# Patient Record
Sex: Male | Born: 1960 | Race: Black or African American | Hispanic: No | Marital: Single | Smoking: Former smoker
Health system: Southern US, Community
[De-identification: ages and names within clinical notes are randomized; demographics above are authoritative.]

## PROBLEM LIST (undated history)

## (undated) DIAGNOSIS — E119 Type 2 diabetes mellitus without complications: Secondary | ICD-10-CM

## (undated) DIAGNOSIS — I00 Rheumatic fever without heart involvement: Secondary | ICD-10-CM

## (undated) HISTORY — DX: Type 2 diabetes mellitus without complications: E11.9

---

## 1998-12-20 ENCOUNTER — Encounter: Admission: RE | Admit: 1998-12-20 | Discharge: 1999-03-20 | Payer: Self-pay | Admitting: *Deleted

## 1999-04-11 ENCOUNTER — Encounter: Admission: RE | Admit: 1999-04-11 | Discharge: 1999-07-10 | Payer: Self-pay | Admitting: *Deleted

## 2002-04-15 ENCOUNTER — Emergency Department (HOSPITAL_COMMUNITY): Admission: EM | Admit: 2002-04-15 | Discharge: 2002-04-15 | Payer: Self-pay | Admitting: Emergency Medicine

## 2015-11-18 ENCOUNTER — Emergency Department (HOSPITAL_COMMUNITY)
Admission: EM | Admit: 2015-11-18 | Discharge: 2015-11-18 | Disposition: A | Discharge status: Home / Self Care | Payer: 59 | Attending: Emergency Medicine | Admitting: Emergency Medicine

## 2015-11-18 ENCOUNTER — Emergency Department (HOSPITAL_COMMUNITY): Payer: 59

## 2015-11-18 ENCOUNTER — Encounter (HOSPITAL_COMMUNITY): Payer: Self-pay

## 2015-11-18 DIAGNOSIS — X58XXXA Exposure to other specified factors, initial encounter: Secondary | ICD-10-CM | POA: Insufficient documentation

## 2015-11-18 DIAGNOSIS — S76912A Strain of unspecified muscles, fascia and tendons at thigh level, left thigh, initial encounter: Secondary | ICD-10-CM | POA: Diagnosis not present

## 2015-11-18 DIAGNOSIS — S79922A Unspecified injury of left thigh, initial encounter: Secondary | ICD-10-CM | POA: Diagnosis present

## 2015-11-18 DIAGNOSIS — Y9289 Other specified places as the place of occurrence of the external cause: Secondary | ICD-10-CM | POA: Diagnosis not present

## 2015-11-18 DIAGNOSIS — Y9389 Activity, other specified: Secondary | ICD-10-CM | POA: Insufficient documentation

## 2015-11-18 DIAGNOSIS — Y998 Other external cause status: Secondary | ICD-10-CM | POA: Diagnosis not present

## 2015-11-18 DIAGNOSIS — Z87891 Personal history of nicotine dependence: Secondary | ICD-10-CM | POA: Insufficient documentation

## 2015-11-18 DIAGNOSIS — Z8679 Personal history of other diseases of the circulatory system: Secondary | ICD-10-CM | POA: Diagnosis not present

## 2015-11-18 HISTORY — DX: Rheumatic fever without heart involvement: I00

## 2015-11-18 MED ORDER — METHOCARBAMOL 500 MG PO TABS: 500.0000 mg | ORAL_TABLET | Freq: Two times a day (BID) | ORAL | Status: DC

## 2015-11-18 MED ORDER — KETOROLAC TROMETHAMINE 30 MG/ML IJ SOLN
30.0000 mg | Freq: Once | INTRAMUSCULAR | Status: AC
  Administered 2015-11-18: 30 mg via INTRAMUSCULAR
  Filled 2015-11-18: qty 1

## 2015-11-18 MED ORDER — METHOCARBAMOL 500 MG PO TABS
1000.0000 mg | ORAL_TABLET | Freq: Once | ORAL | Status: AC
  Administered 2015-11-18: 1000 mg via ORAL
  Filled 2015-11-18: qty 2

## 2015-11-18 MED ORDER — IBUPROFEN 800 MG PO TABS: 800.0000 mg | ORAL_TABLET | Freq: Three times a day (TID) | ORAL | Status: AC

## 2015-11-18 NOTE — ED Provider Notes (Signed)
CSN: [id number]     Arrival date & time 11/18/15  1341 History  By signing my name below, I, Emmanuella Mensah, attest that this documentation has been prepared under the direction and in the presence of [facility], PA-C. Electronically Signed: Angelene Giovanni, ED Scribe. 11/18/2015. 2:21 PM.    Chief Complaint  Patient presents with  . Leg Pain    lt thigh/buttocks area    The history is provided by the patient. No language interpreter was used.   HPI Comments: Adam Pearson is a 55 y.o. male who presents to the Emergency Department complaining of gradually worsening intermittent moderate sharp pain to the left lateral side of his left upper leg onset 2 weeks ago. He reports associated difficulty ambulating secondary to pain. He explains that the pain usually resolves when he gets in the car to go to work in the am. He denies any recent falls, injuries, or trauma. He states that he took 4 tablets of Advil today PTA with no relief. He denies any back pain, abdominal pain, groin pain, testicular pain, color change, rash, or lacerations.    Past Medical History  Diagnosis Date  . Rheumatic fever    History reviewed. No pertinent past surgical history. History reviewed. No pertinent family history. Social History  Substance Use Topics  . Smoking status: Former Games developer  . Smokeless tobacco: None  . Alcohol Use: Yes    Review of Systems  Gastrointestinal: Negative for abdominal pain.  Genitourinary: Negative for penile pain and testicular pain.  Musculoskeletal: Positive for arthralgias (left lateral side of left upper leg) and gait problem (secondary to pain). Negative for back pain.  Skin: Negative for color change, rash and wound.      Allergies  Review of patient's allergies indicates no known allergies.  Home Medications   Prior to Admission medications   Medication Sig Start Date End Date Taking? Authorizing Provider  ibuprofen (ADVIL,MOTRIN) 800 MG tablet Take 1 tablet  (800 mg total) by mouth 3 (three) times daily. 11/18/15   Chase Picket Karin Pinedo, PA-C  methocarbamol (ROBAXIN) 500 MG tablet Take 1 tablet (500 mg total) by mouth 2 (two) times daily. 11/18/15   Riki Gehring Pilcher Dauna Ziska, PA-C   BP 133/78 mmHg  Pulse 70  Temp(Src) 99 F (37.2 C) (Oral)  Resp 16  SpO2 100% Physical Exam  Constitutional: He is oriented to person, place, and time. He appears well-developed and well-nourished.  HENT:  Head: Normocephalic and atraumatic.  Neck: Normal range of motion. Neck supple.  Cardiovascular: Normal rate, regular rhythm and normal heart sounds.   Pulmonary/Chest: Effort normal and breath sounds normal. No respiratory distress.  Abdominal: Soft. Bowel sounds are normal. He exhibits no distension. There is no tenderness.  Musculoskeletal:  TTP of left lateral thigh. Full ROM. Pain with Ober's test. No deformities, erythema, ecchymosis, or swelling appreciated. Good cap refill in LLE. 2+ distal pulse.   Neurological: He is alert and oriented to person, place, and time.  Skin: Skin is warm and dry.  Psychiatric: He has a normal mood and affect.  Nursing note and vitals reviewed.   ED Course  Procedures (including critical care time) DIAGNOSTIC STUDIES: Oxygen Saturation is 100% on RA, normal by my interpretation.    COORDINATION OF CARE: 2:19 PM- Pt advised of plan for treatment and pt agrees. Pt will receive x-ray for further evaluation. He will receive muscle relaxants.    Labs Review Labs Reviewed - No data to display  Imaging Review Dg Hip  Unilat With Pelvis 2-3 Views Left  11/18/2015  CLINICAL DATA:  Left lateral hip pain upon wakening today. EXAM: DG HIP (WITH OR WITHOUT PELVIS) 2-3V LEFT COMPARISON:  None. FINDINGS: There is no evidence of hip fracture or dislocation. There is no evidence of arthropathy or other focal bone abnormality. IMPRESSION: Normal radiographs Electronically Signed   By: Paulina Fusi M.D.   On: 11/18/2015 14:45   Dg Femur 1v  Left  11/18/2015  CLINICAL DATA:  Lateral left hip and femur pain. EXAM: Left femur, one-view) COMPARISON:  None. FINDINGS: Study reviewed along with the left hip exam performed at same time. A single frontal view of the left femur shows no evidence for an acute fracture. No worrisome lytic or sclerotic osseous abnormality. IMPRESSION: No evidence for femur fracture. Electronically Signed   By: Kennith Center M.D.   On: 11/18/2015 14:48     Marijean Niemann Morris Markham, PA-C has personally reviewed and evaluated these images and lab results as part of her medical decision-making.   MDM   Final diagnoses:  Muscle strain of thigh, left, initial encounter   Adam Pearson presents with left lateral leg pain which appears musculoskeletal in nature. X-rays unremarkable. Patient given Toradol IM and robaxin in ED. Re-evaluated and feels improved. Will dc to home with RICE, symptomatic care, Robaxin and ibuprofen. Return precautions and follow up instructions given. All questions answered.   I personally performed the services described in this documentation, which was scribed in my presence. The recorded information has been reviewed and is accurate.  [facility] Mayerly Kaman, PA-C 11/18/15 1512  Arby Barrette, MD 11/24/15 2155

## 2015-11-18 NOTE — ED Notes (Signed)
Patient transported to X-ray 

## 2015-11-18 NOTE — ED Notes (Signed)
Pt c/o intermittent L groin pain x 2 weeks which became constant yesterday.  Pain score 10/10.  Pt reports taking ibuprofen w/o relief.  Denies injury.  Denies GU complaints.  Denies discoloration, warmth, and swelling.

## 2015-11-18 NOTE — Discharge Instructions (Signed)
Be sure to read and understand instructions below prior to leaving the hospital. Take ibuprofen three times daily for the next three days, after that, take as needed for pain. Take robaxin (muscle relaxer) as needed for pain - This can make you very drowsy - please do not drink or drive on this medication. Return to ER for any new or worsening symptoms, any additional concerns.   TREATMENT  Rest, ice, elevation, and compression are the basic modes of treatment.    HOME CARE INSTRUCTIONS  Apply ice to the sore area for 15 to 20 minutes, 3 to 4 times per day. Do this while you are awake for the first 2 days. This can be stopped when the swelling goes away. Put the ice in a plastic bag and place a towel between the bag of ice and your skin.  Keep your leg elevated when possible to lessen swelling.  ACTIVITY:            - Weight bearing as tolerated            - Exercises should be limited to pain free range of motion       SEEK MEDICAL CARE IF:  You have an increase in bruising, swelling, or pain.  Your toes feel cold.   Pain relief is not achieved with medications.   MAKE SURE YOU:  Understand these instructions.  Will watch your condition.  Will get help right away if you are not doing well or get worse   COLD THERAPY DIRECTIONS:  Ice or gel packs can be used to reduce both pain and swelling. Ice is the most helpful within the first 24 to 48 hours after an injury or flareup from overusing a muscle or joint.  Ice is effective, has very few side effects, and is safe for most people to use.   If you expose your skin to cold temperatures for too long or without the proper protection, you can damage your skin or nerves. Watch for signs of skin damage due to cold.   HOME CARE INSTRUCTIONS  Follow these tips to use ice and cold packs safely.  Place a dry or damp towel between the ice and skin. A damp towel will cool the skin more quickly, so you may need to shorten the time that the ice is  used.  For a more rapid response, add gentle compression to the ice.  Ice for no more than 10 to 20 minutes at a time. The bonier the area you are icing, the less time it will take to get the benefits of ice.  Check your skin after 5 minutes to make sure there are no signs of a poor response to cold or skin damage.  Rest 20 minutes or more in between uses.  Once your skin is numb, you can end your treatment. You can test numbness by very lightly touching your skin. The touch should be so light that you do not see the skin dimple from the pressure of your fingertip. When using ice, most people will feel these normal sensations in this order: cold, burning, aching, and numbness.

## 2016-12-18 DIAGNOSIS — E7800 Pure hypercholesterolemia, unspecified: Secondary | ICD-10-CM | POA: Diagnosis not present

## 2016-12-18 DIAGNOSIS — E1165 Type 2 diabetes mellitus with hyperglycemia: Secondary | ICD-10-CM | POA: Diagnosis not present

## 2016-12-18 DIAGNOSIS — Z0000 Encounter for general adult medical examination without abnormal findings: Secondary | ICD-10-CM | POA: Diagnosis not present

## 2017-02-23 DIAGNOSIS — E119 Type 2 diabetes mellitus without complications: Secondary | ICD-10-CM | POA: Diagnosis not present

## 2017-02-23 DIAGNOSIS — H40013 Open angle with borderline findings, low risk, bilateral: Secondary | ICD-10-CM | POA: Diagnosis not present

## 2017-02-23 DIAGNOSIS — H35363 Drusen (degenerative) of macula, bilateral: Secondary | ICD-10-CM | POA: Diagnosis not present

## 2017-03-17 IMAGING — CR DG FEMUR 1V*L*
2 series · 2 of 2 positions shown · non-contrast
Comparison: None.

CLINICAL DATA: Lateral left hip and femur pain.

EXAM:
Left femur, one-view)

[t femur proximal ap left]
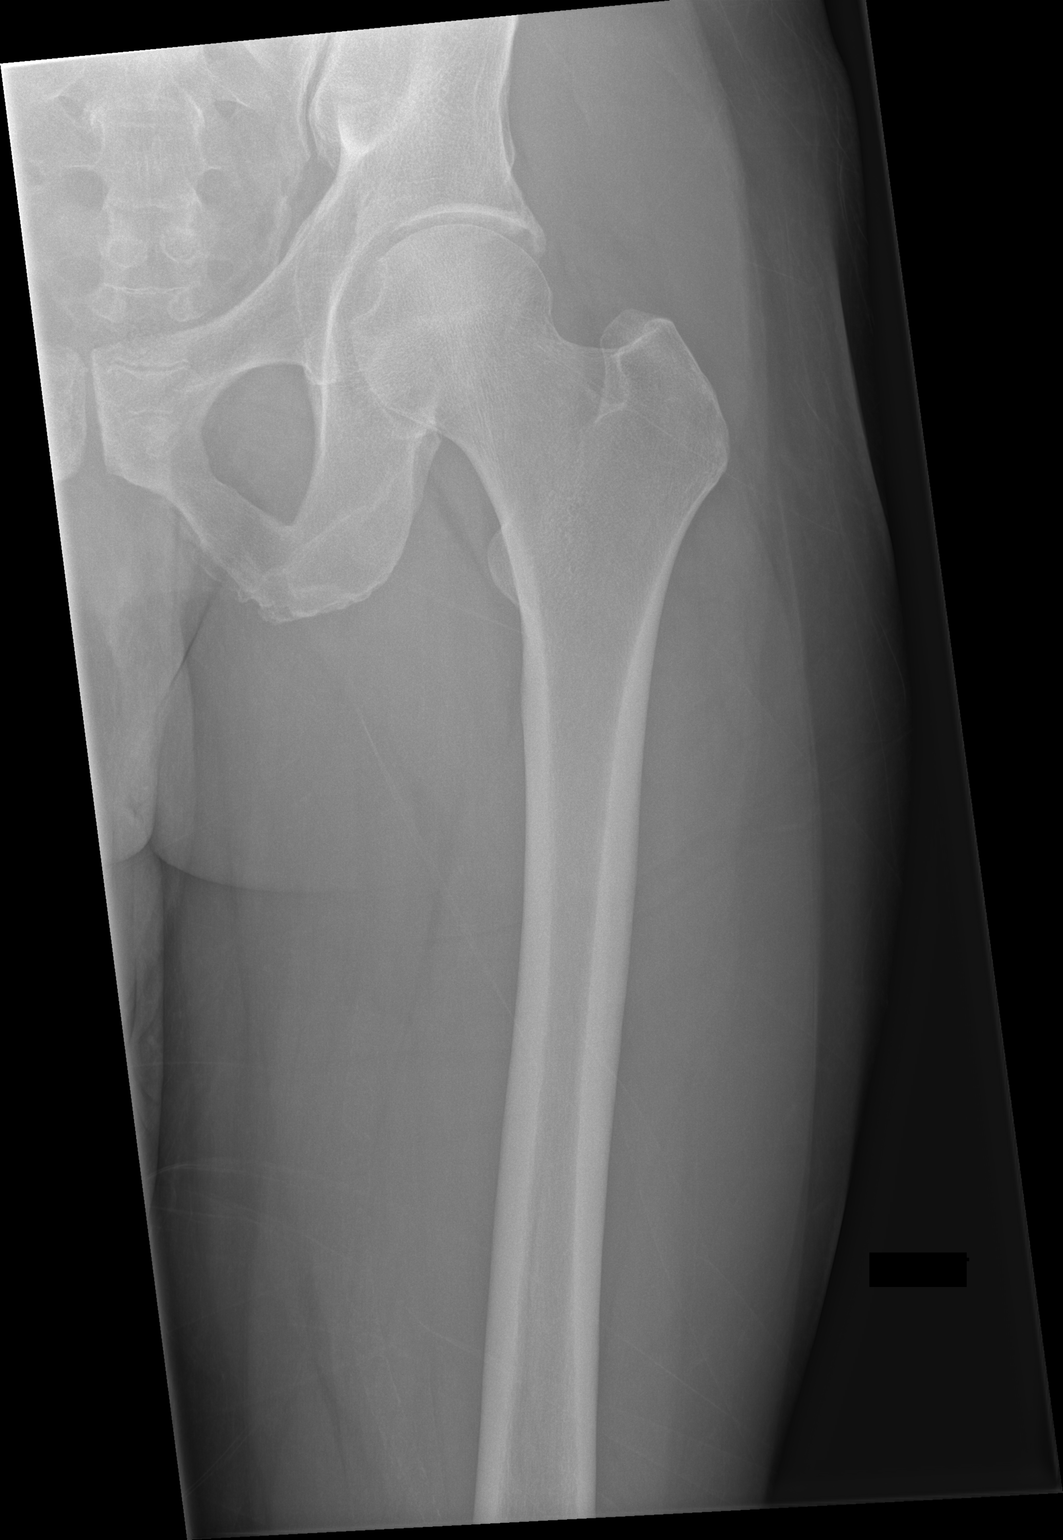

[t femur distal ap left]
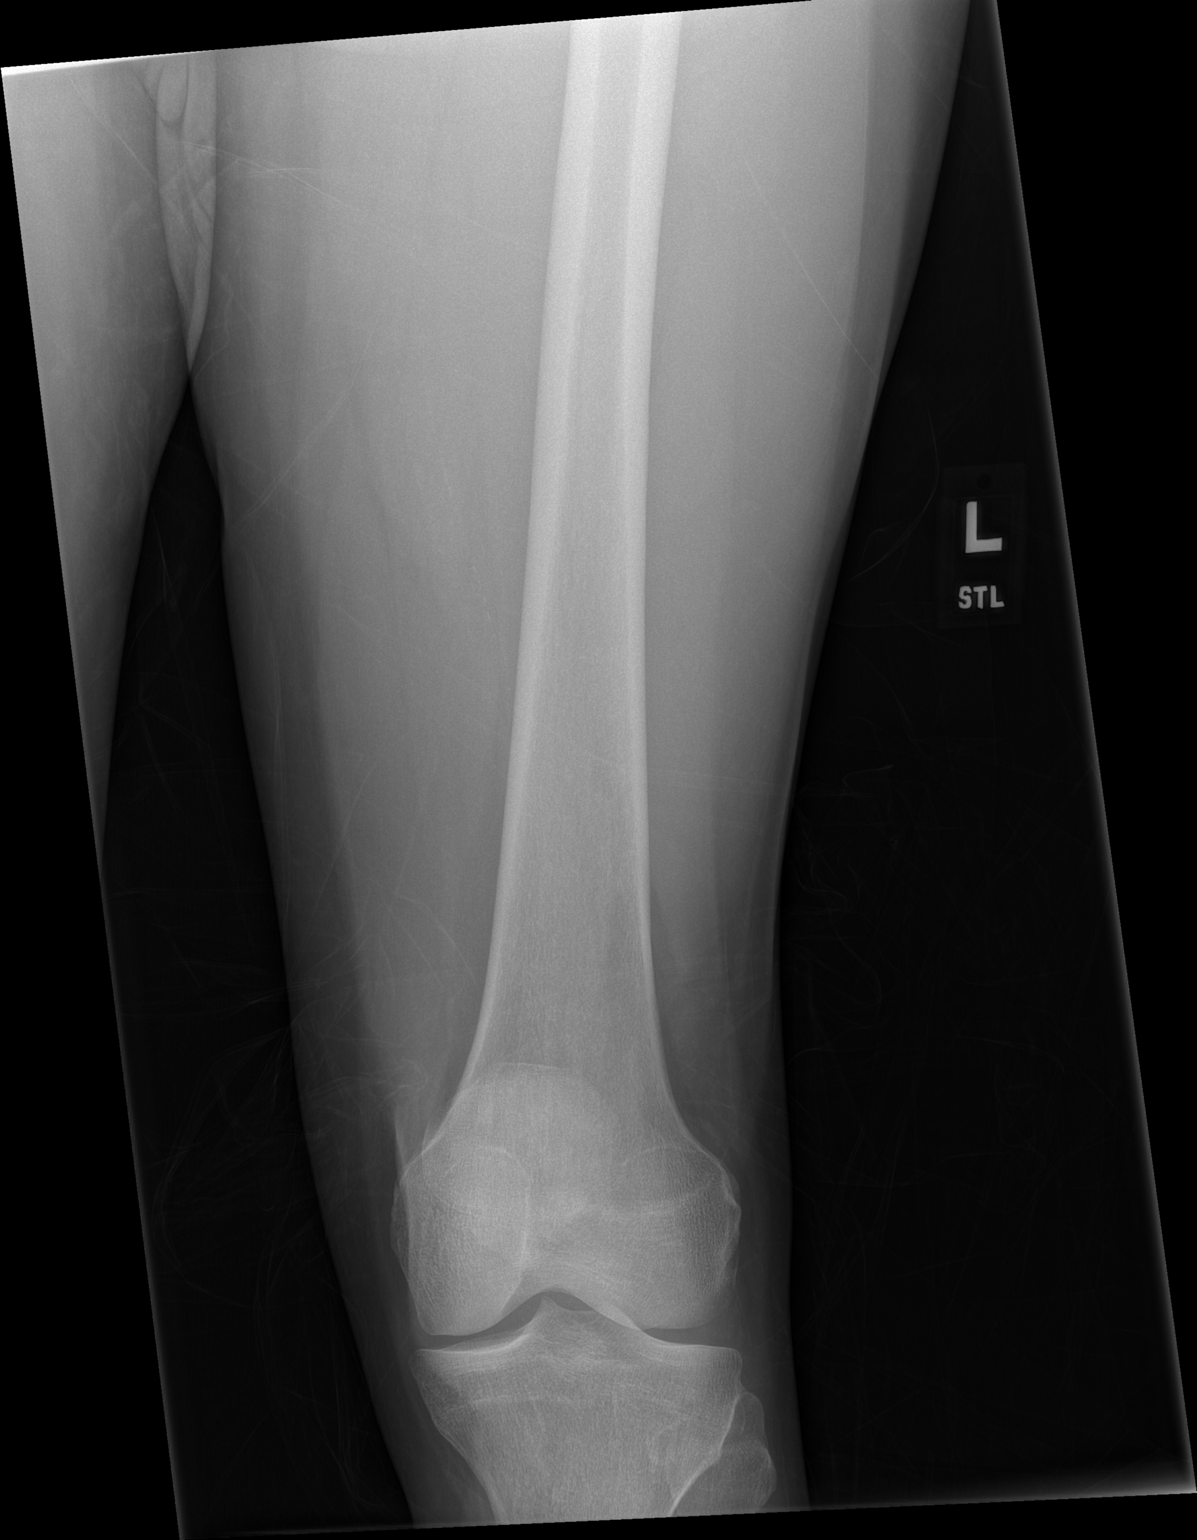

[2 of 2 positions shown; findings below may reference images not displayed]

FINDINGS: Study reviewed along with the left hip exam performed at same time.
A single frontal view of the left femur shows no evidence for an
acute fracture. No worrisome lytic or sclerotic osseous abnormality.
IMPRESSION: No evidence for femur fracture.

## 2017-03-19 DIAGNOSIS — E119 Type 2 diabetes mellitus without complications: Secondary | ICD-10-CM | POA: Diagnosis not present

## 2017-03-19 DIAGNOSIS — R030 Elevated blood-pressure reading, without diagnosis of hypertension: Secondary | ICD-10-CM | POA: Diagnosis not present

## 2017-03-19 DIAGNOSIS — E7800 Pure hypercholesterolemia, unspecified: Secondary | ICD-10-CM | POA: Diagnosis not present

## 2017-08-14 DIAGNOSIS — B351 Tinea unguium: Secondary | ICD-10-CM | POA: Diagnosis not present

## 2017-08-14 DIAGNOSIS — B353 Tinea pedis: Secondary | ICD-10-CM | POA: Diagnosis not present

## 2017-08-25 DIAGNOSIS — H40013 Open angle with borderline findings, low risk, bilateral: Secondary | ICD-10-CM | POA: Diagnosis not present

## 2017-09-21 DIAGNOSIS — E7800 Pure hypercholesterolemia, unspecified: Secondary | ICD-10-CM | POA: Diagnosis not present

## 2017-09-21 DIAGNOSIS — R030 Elevated blood-pressure reading, without diagnosis of hypertension: Secondary | ICD-10-CM | POA: Diagnosis not present

## 2017-09-21 DIAGNOSIS — E119 Type 2 diabetes mellitus without complications: Secondary | ICD-10-CM | POA: Diagnosis not present

## 2018-03-01 DIAGNOSIS — E119 Type 2 diabetes mellitus without complications: Secondary | ICD-10-CM | POA: Diagnosis not present

## 2018-03-01 DIAGNOSIS — H2513 Age-related nuclear cataract, bilateral: Secondary | ICD-10-CM | POA: Diagnosis not present

## 2018-03-01 DIAGNOSIS — H40013 Open angle with borderline findings, low risk, bilateral: Secondary | ICD-10-CM | POA: Diagnosis not present

## 2018-04-27 DIAGNOSIS — E7800 Pure hypercholesterolemia, unspecified: Secondary | ICD-10-CM | POA: Diagnosis not present

## 2018-04-27 DIAGNOSIS — Z0000 Encounter for general adult medical examination without abnormal findings: Secondary | ICD-10-CM | POA: Diagnosis not present

## 2018-04-27 DIAGNOSIS — E1169 Type 2 diabetes mellitus with other specified complication: Secondary | ICD-10-CM | POA: Diagnosis not present

## 2018-04-27 DIAGNOSIS — Z1159 Encounter for screening for other viral diseases: Secondary | ICD-10-CM | POA: Diagnosis not present

## 2018-04-27 DIAGNOSIS — Z1211 Encounter for screening for malignant neoplasm of colon: Secondary | ICD-10-CM | POA: Diagnosis not present

## 2018-04-27 DIAGNOSIS — Z125 Encounter for screening for malignant neoplasm of prostate: Secondary | ICD-10-CM | POA: Diagnosis not present

## 2021-11-27 ENCOUNTER — Encounter: Payer: Self-pay | Admitting: Neurology

## 2022-01-01 ENCOUNTER — Ambulatory Visit: Payer: 59 | Admitting: Neurology

## 2022-01-01 ENCOUNTER — Encounter: Payer: Self-pay | Admitting: Neurology

## 2022-01-01 ENCOUNTER — Other Ambulatory Visit: Payer: Self-pay

## 2022-01-01 VITALS — BP 147/84 | HR 75 | Resp 18 | Ht 71.0 in | Wt 185.0 lb

## 2022-01-01 DIAGNOSIS — G43009 Migraine without aura, not intractable, without status migrainosus: Secondary | ICD-10-CM

## 2022-01-01 DIAGNOSIS — H9313 Tinnitus, bilateral: Secondary | ICD-10-CM

## 2022-01-01 DIAGNOSIS — R42 Dizziness and giddiness: Secondary | ICD-10-CM | POA: Diagnosis not present

## 2022-01-01 NOTE — Progress Notes (Signed)
? ?NEUROLOGY CONSULTATION NOTE ? ?Hanley Seamen ?MRN: [id number] ?DOB: 05-13-1961 ? ?Referring provider: Dr. Tally Joe ?Primary care provider: Dr. Tally Joe ? ?Reason for consult:  vertigo ? ?Dear Dr Azucena Cecil: ? ?Thank you for your kind referral of Lekendrick Alpern for consultation of the above symptoms. Although his history is well known to you, please allow me to reiterate it for the purpose of our medical record. He is alone in the office today. Records and images were personally reviewed where available. ? ? ?HISTORY OF PRESENT ILLNESS: ?This is a 61 year old right-handed man with a history of diet-controlled diabetes, herniated disc, migraines, presenting for evaluation of vertigo. He reports that after he was treated for a herniated disc 6 years ago, he started having vertigo and slight migraines. He recalls with the first episode he fell forward and passed out for a couple of minutes. He would have it when bending down with the weed eater, he would bend over, stand then fall over. They were initially occurring every couple of months, but recently have increased to 3-4 times a month. They usually last 15-25 seconds. He is usually aware of his surroundings, no confusion. He has to stop talking because he is scared, but he can talk/comprehend. He has had it while sitting on the toilet, or while standing and talking, the room would start spinning and he staggers, feeling like he will fall down and have to catch himself. If he was sitting on the toilet, he may fall to the right or forward, sometimes he staggers to the left side. He denies any vertigo when supine. There is no nausea/vomiting. He reports pulsatile tinnitus, he can hear his heart beating with swooshing and popping in both ears, worse on the left. No vision changes. Sometimes his right hand and feet would tingle when supine. He has a charley horse in his left leg, right hand, and both feet. He also reports migraines that occur separately, also  happening 3-4 times a month depending on his activities, worse when hot. He describes pulsating/throbbing pain on the frontal region radiating to the left lasting 30 minutes to 3 hours. If the pain is moderate, he does not take medication. For severe pain he takes Excedrin migraine but it does not help. He takes it around once a month. He is sensitive to lights, no nausea/vomiting. His older sister has migraines. He lives alone. He states the vertigo does not occur when driving. He denies any olfactory/gustatory hallucinations, myoclonic jerks, staring/unresponsive episodes. He denies any gaps in time but has a short attention span, people have told him they have to repeat themselves, he says he cannot hear them. He reports hearing loss. He has been working as a Designer, industrial/product for 19 years. He usually gets 6-7 hours of sleep. He drinks around 2 beers every 2 weeks.  ? ? ?PAST MEDICAL HISTORY: ?Past Medical History:  ?Diagnosis Date  ? Diabetes mellitus without complication (HCC)   ? Rheumatic fever   ? ? ?PAST SURGICAL HISTORY: ?History reviewed. No pertinent surgical history. ? ?MEDICATIONS: ?Current Outpatient Medications on File Prior to Visit  ?Medication Sig Dispense Refill  ? ibuprofen (ADVIL,MOTRIN) 800 MG tablet Take 1 tablet (800 mg total) by mouth 3 (three) times daily. (Patient not taking: Reported on 01/01/2022) 21 tablet 0  ? methocarbamol (ROBAXIN) 500 MG tablet Take 1 tablet (500 mg total) by mouth 2 (two) times daily. (Patient not taking: Reported on 01/01/2022) 20 tablet 0  ? ?No current facility-administered medications on  file prior to visit.  ? ? ?ALLERGIES: ?No Known Allergies ? ?FAMILY HISTORY: ?History reviewed. No pertinent family history. ? ?SOCIAL HISTORY: ?Social History  ? ?Socioeconomic History  ? Marital status: Single  ?  Spouse name: Not on file  ? Number of children: 2  ? Years of education: Not on file  ? Highest education level: Not on file  ?Occupational History  ? Not on file   ?Tobacco Use  ? Smoking status: Former  ? Smokeless tobacco: Not on file  ?Substance and Sexual Activity  ? Alcohol use: Yes  ? Drug use: No  ? Sexual activity: Not on file  ?Other Topics Concern  ? Not on file  ?Social History Narrative  ? Right handed  ? No caffeine  ? One story home  ? ?Social Determinants of Health  ? ?Financial Resource Strain: Not on file  ?Food Insecurity: Not on file  ?Transportation Needs: Not on file  ?Physical Activity: Not on file  ?Stress: Not on file  ?Social Connections: Not on file  ?Intimate Partner Violence: Not on file  ? ? ? ?PHYSICAL EXAM: ?Vitals:  ? 01/01/22 0853  ?BP: (!) 147/84  ?Pulse: 75  ?Resp: 18  ?SpO2: 98%  ? ?General: No acute distress ?Head:  Normocephalic/atraumatic ?Skin/Extremities: No rash, no edema ?Neurological Exam: ?Mental status: alert and awake, no dysarthria or aphasia, Fund of knowledge is appropriate.  Attention and concentration are normal.    ?Cranial nerves: ?CN I: not tested ?CN II: pupils equal, round and reactive to light, visual fields intact ?CN III, IV, VI:  full range of motion, no nystagmus, no ptosis ?CN V: facial sensation intact ?CN VII: upper and lower face symmetric ?CN VIII: hearing intact to conversation ?CN IX, X: gag intact, uvula midline ?CN XI: sternocleidomastoid and trapezius muscles intact ?CN XII: tongue midline ?Bulk & Tone: normal, no fasciculations. ?Motor: 5/5 throughout with no pronator drift. ?Sensation: intact to light touch, cold, pin, vibration sense.  No extinction to double simultaneous stimulation.  Romberg test negative ?Deep Tendon Reflexes: +2 throughout ?Cerebellar: no incoordination on finger to nose testing ?Gait: narrow-based and steady, able to tandem walk adequately. ?Tremor: none ? ? ?IMPRESSION: ?This is a 61 year old right-handed man with a history of diet-controlled diabetes, herniated disc, migraines, presenting for evaluation of vertigo. His neurological exam is normal. He reports episodes of vertigo,  as well as migraines, occurring independently of each other 3-4 times a month. His neurological exam is normal. MRI brain with and without contrast will be ordered to assess for underlying structural abnormality. He reports pulsatile tinnitus, MRA head without contrast will be ordered to assess for vascular abnormality. We will do an EEG for completion. We discussed doing vestibular therapy for the vertigo and starting migraine prophylactic medication. He is agreeable to starting Topiramate 25mg  qhs x 1 week, then increase to 50mg  qhs, side effects discussed. He will be referred to ENT for evaluation of tinnitus, hearing loss, and vertigo. Follow-up in 3 months, call for any changes.  ? ? ?Thank you for allowing me to participate in the care of this patient. Please do not hesitate to call for any questions or concerns. ? ? ? , M.D. ? ?CC: Dr. ? ? ?

## 2022-01-01 NOTE — Patient Instructions (Addendum)
Good to meet you. ? ?Schedule MRI brain with and without contrast and MRA head without contrast ? ?2. Schedule routine EEG ? ?3. Start Topiramate 25mg : take 1 tablet every night for 1 week, then increase to 2 tablets every night. ? ?4. Refer to Vestibular therapy ? ?5. Refer to ENT for tinnitus, hearing loss, vertigo ? ?6. Follow-up in 3 months, call for any changes ? ?We have sent a referral to [facility] for your MRI and MRA  they will call you directly to schedule your appointment. They are located at [address] [facility]. If you need to contact them directly please call [phone].  ? ? ?

## 2022-01-18 ENCOUNTER — Other Ambulatory Visit: Payer: 59

## 2022-01-20 ENCOUNTER — Ambulatory Visit: Payer: 59 | Admitting: Neurology

## 2022-01-20 DIAGNOSIS — R42 Dizziness and giddiness: Secondary | ICD-10-CM

## 2022-01-20 NOTE — Procedures (Signed)
ELECTROENCEPHALOGRAM REPORT ? ?Date of Study: 01/20/2022 ? ?Patient's Name: Adam Pearson ?MRN: [id number] ?Date of Birth: 06-Mar-1961 ? ?Referring Provider: Dr. Patrcia Dolly ? ?Clinical History: This is a 61 year old man with recurrent vertigo, initially with loss of consciousness. EEG for classification. ? ?Medications: ?none ? ?Technical Summary: ?A multichannel digital EEG recording measured by the international 10-20 system with electrodes applied with paste and impedances below 5000 ohms performed in our laboratory with EKG monitoring in an awake and asleep patient.  Hyperventilation and photic stimulation were performed.  The digital EEG was referentially recorded, reformatted, and digitally filtered in a variety of bipolar and referential montages for optimal display.   ? ?Description: ?The patient is awake and asleep during the recording.  During maximal wakefulness, there is a symmetric, medium voltage 10 Hz posterior dominant rhythm that attenuates with eye opening.  The record is symmetric.  During drowsiness and sleep, there is an increase in theta slowing of the background.  Vertex waves and symmetric sleep spindles were seen.  Hyperventilation and photic stimulation did not elicit any abnormalities.  There were no epileptiform discharges or electrographic seizures seen.   ? ?EKG lead was unremarkable. ? ?Impression: ?This awake and asleep EEG is normal.   ? ?Clinical Correlation: ?A normal EEG does not exclude a clinical diagnosis of epilepsy.  If further clinical questions remain, prolonged EEG may be helpful.  Clinical correlation is advised. ? ? ?Patrcia Dolly, M.D. ? ?

## 2022-01-21 ENCOUNTER — Telehealth: Payer: Self-pay

## 2022-01-21 NOTE — Telephone Encounter (Signed)
-----   Message from Van Clines, MD sent at 01/21/2022  1:49 PM EDT ----- ?Pls let him know brain wave test was normal, thanks ?

## 2022-01-21 NOTE — Telephone Encounter (Signed)
Pt called informed brain wave test was normal ?

## 2022-02-01 ENCOUNTER — Other Ambulatory Visit: Payer: 59

## 2022-02-17 ENCOUNTER — Ambulatory Visit: Payer: 59 | Admitting: Neurology

## 2022-02-25 ENCOUNTER — Telehealth: Payer: Self-pay

## 2022-02-25 NOTE — Telephone Encounter (Signed)
Pt called no answer left a voice mail per dpr to call resolve to get scheduled for vestibular therapy  ?

## 2022-02-25 NOTE — Telephone Encounter (Signed)
-----   Message from Van Clines, MD sent at 02/24/2022  2:53 PM EDT ----- ?Pls let him know we got a note from physical therapy that they have tried to contact him for the vestibular therapy for his vertigo. Pls have him call them if he would like to proceed (info on Media tab) ? ?Thanks! ? ?----- Message ----- ?From: White, Kristina W ?Sent: 02/24/2022   2:05 PM EDT ?To: Van Clines, MD ? ? ?

## 2022-04-04 ENCOUNTER — Ambulatory Visit: Payer: 59 | Admitting: Neurology

## 2022-04-04 ENCOUNTER — Encounter: Payer: Self-pay | Admitting: Neurology

## 2022-04-04 VITALS — BP 134/75 | HR 74 | Ht 71.0 in | Wt 182.6 lb

## 2022-04-04 DIAGNOSIS — R42 Dizziness and giddiness: Secondary | ICD-10-CM

## 2022-04-04 DIAGNOSIS — G43009 Migraine without aura, not intractable, without status migrainosus: Secondary | ICD-10-CM

## 2022-04-04 MED ORDER — TOPIRAMATE 25 MG PO TABS: ORAL_TABLET | ORAL | 11 refills | Status: DC

## 2022-04-04 NOTE — Patient Instructions (Signed)
Start Topiramate 25mg : take 1 tablet every night for 1 week, then increase to 2 tablets every night and continue  2. Once ready to proceed with MRI brain and vestibular therapy, please let know, we may need to resend the orders  3. Follow-up in 6 months, call for any changes

## 2022-04-04 NOTE — Progress Notes (Signed)
NEUROLOGY FOLLOW UP OFFICE NOTE  Adam Pearson [id number] [birth year]  HISTORY OF PRESENT ILLNESS: I had the pleasure of seeing Adam Pearson in follow-up in the neurology clinic on 04/04/2022.  The patient was last seen 3 months ago for vertigo and migraines. He is alone in the office today. Records and images were personally reviewed where available. EEG in 01/2022 was normal. Since his last visit, he has had several saddening events occur in his family, including his mother's recent passing. He was unable to proceed with MRI/MRA brain and unable to schedule vestibular therapy or ENT evaluation for tinnitus. He continues to have vertigo 2-3 times a month. No falls. He continues to have migraines 3-4 times a month. He reports being in a state of depression and plans to see someone through the [place]. He lives alone.   History on Initial Assessment 01/01/2022: This is a 61 year old right-handed man with a history of diet-controlled diabetes, herniated disc, migraines, presenting for evaluation of vertigo. He reports that after he was treated for a herniated disc 6 years ago, he started having vertigo and slight migraines. He recalls with the first episode he fell forward and passed out for a couple of minutes. He would have it when bending down with the weed eater, he would bend over, stand then fall over. They were initially occurring every couple of months, but recently have increased to 3-4 times a month. They usually last 15-25 seconds. He is usually aware of his surroundings, no confusion. He has to stop talking because he is scared, but he can talk/comprehend. He has had it while sitting on the toilet, or while standing and talking, the room would start spinning and he staggers, feeling like he will fall down and have to catch himself. If he was sitting on the toilet, he may fall to the right or forward, sometimes he staggers to the left side. He denies any vertigo when supine. There is no  nausea/vomiting. He reports pulsatile tinnitus, he can hear his heart beating with swooshing and popping in both ears, worse on the left. No vision changes. Sometimes his right hand and feet would tingle when supine. He has a charley horse in his left leg, right hand, and both feet. He also reports migraines that occur separately, also happening 3-4 times a month depending on his activities, worse when hot. He describes pulsating/throbbing pain on the frontal region radiating to the left lasting 30 minutes to 3 hours. If the pain is moderate, he does not take medication. For severe pain he takes Excedrin migraine but it does not help. He takes it around once a month. He is sensitive to lights, no nausea/vomiting. His older sister has migraines. He lives alone. He states the vertigo does not occur when driving. He denies any olfactory/gustatory hallucinations, myoclonic jerks, staring/unresponsive episodes. He denies any gaps in time but has a short attention span, people have told him they have to repeat themselves, he says he cannot hear them. He reports hearing loss. He has been working as a Designer, industrial/product for 19 years. He usually gets 6-7 hours of sleep. He drinks around 2 beers every 2 weeks.    PAST MEDICAL HISTORY: Past Medical History:  Diagnosis Date   Diabetes mellitus without complication (HCC)    Rheumatic fever     MEDICATIONS: Current Outpatient Medications on File Prior to Visit  Medication Sig Dispense Refill   ketoconazole (NIZORAL) 2 % cream Apply topically 2 (two) times daily as needed.  ibuprofen (ADVIL,MOTRIN) 800 MG tablet Take 1 tablet (800 mg total) by mouth 3 (three) times daily. (Patient not taking: Reported on 01/01/2022) 21 tablet 0   No current facility-administered medications on file prior to visit.    ALLERGIES: No Known Allergies  FAMILY HISTORY: History reviewed. No pertinent family history.  SOCIAL HISTORY: Social History   Socioeconomic History    Marital status: Single    Spouse name: Not on file   Number of children: 2   Years of education: Not on file   Highest education level: Not on file  Occupational History   Not on file  Tobacco Use   Smoking status: Former   Smokeless tobacco: Not on file  Vaping Use   Vaping Use: Never used  Substance and Sexual Activity   Alcohol use: Yes   Drug use: No   Sexual activity: Not on file  Other Topics Concern   Not on file  Social History Narrative   Right handed   No caffeine   One story home   Social Determinants of Health   Financial Resource Strain: Not on file  Food Insecurity: Not on file  Transportation Needs: Not on file  Physical Activity: Not on file  Stress: Not on file  Social Connections: Not on file  Intimate Partner Violence: Not on file     PHYSICAL EXAM: Vitals:   04/04/22 1414  BP: 134/75  Pulse: 74  SpO2: 98%   General: No acute distress Head:  Normocephalic/atraumatic Skin/Extremities: No rash, no edema Neurological Exam: alert and awake. No aphasia or dysarthria. Fund of knowledge is appropriate.  Attention and concentration are normal.   Cranial nerves: Pupils equal, round. Extraocular movements intact with no nystagmus. Visual fields full.  No facial asymmetry.  Motor: Bulk and tone normal, muscle strength 5/5 throughout with no pronator drift.   Finger to nose testing intact.  Gait narrow-based and steady, no ataxia   IMPRESSION: This is a 61 yo RH man with a history of diet-controlled diabetes, herniated disc, with vertigo and migraines occurring independently of each other. He has been unable to proceed with MRI/MRA brain, vestibular therapy, and ENT evaluation due to family issues. He will let us know once he is ready to proceed and we can re-order if needed. He continues to report 3-4 migraines a month and will start Topiramate 25mg  x 1 week, then increase to 50mg  qhs. Side effects discussed. Follow-up in 6 months, call for any changes.      Thank you for allowing me to participate in his care.  Please do not hesitate to call for any questions or concerns.    , M.D.   CC: Dr. 

## 2022-10-31 ENCOUNTER — Encounter: Payer: Self-pay | Admitting: Neurology

## 2022-10-31 ENCOUNTER — Ambulatory Visit: Payer: 59 | Admitting: Neurology

## 2022-10-31 VITALS — BP 147/84 | HR 72 | Ht 71.0 in | Wt 184.2 lb

## 2022-10-31 DIAGNOSIS — G43009 Migraine without aura, not intractable, without status migrainosus: Secondary | ICD-10-CM | POA: Diagnosis not present

## 2022-10-31 DIAGNOSIS — R42 Dizziness and giddiness: Secondary | ICD-10-CM

## 2022-10-31 DIAGNOSIS — H9313 Tinnitus, bilateral: Secondary | ICD-10-CM | POA: Diagnosis not present

## 2022-10-31 NOTE — Progress Notes (Signed)
NEUROLOGY FOLLOW UP OFFICE NOTE  Adam Pearson [id number] 1961/10/11  HISTORY OF PRESENT ILLNESS: I had the pleasure of seeing Adam Pearson in follow-up in the neurology clinic on 10/31/2022.  The patient was last seen 7 months ago for vertigo and migraines. He is alone in the office today.  He has not yet done MRI due to scheduling issues. He continues to report daily vertigo and migraines 3-4 times a month. He continues to have pulsatile tinnitus with whooshing/ringing sounds in both ears. He tried the Topiramate for migraine prophylaxis but had diarrhea which improved after stopping medication. He was prescribed sumatriptan at the [place] and takes it 3-4 times a month for the severe pounding headaches. Last migraine was last Wednesday. No side effects on sumatriptan. He has 3 levels of headaches, mild, moderate, and severe. The moderate ones occur depending on his activities. He has noticed headaches are longer and worse in the hotter months and quiet down when cooler. He notes vertigo on a daily basis every morning when he gets up. He would be walking and staggering with the room spinning for 10-30 seconds. He stops and sits, he has caught himself before he falls. His knees and back bother him, he wears a back brace daily. e.    History on Initial Assessment 01/01/2022: This is a 62 year old right-handed man with a history of diet-controlled diabetes, herniated disc, migraines, presenting for evaluation of vertigo. He reports that after he was treated for a herniated disc 6 years ago, he started having vertigo and slight migraines. He recalls with the first episode he fell forward and passed out for a couple of minutes. He would have it when bending down with the weed eater, he would bend over, stand then fall over. They were initially occurring every couple of months, but recently have increased to 3-4 times a month. They usually last 15-25 seconds. He is usually aware of his surroundings, no  confusion. He has to stop talking because he is scared, but he can talk/comprehend. He has had it while sitting on the toilet, or while standing and talking, the room would start spinning and he staggers, feeling like he will fall down and have to catch himself. If he was sitting on the toilet, he may fall to the right or forward, sometimes he staggers to the left side. He denies any vertigo when supine. There is no nausea/vomiting. He reports pulsatile tinnitus, he can hear his heart beating with swooshing and popping in both ears, worse on the left. No vision changes. Sometimes his right hand and feet would tingle when supine. He has a charley horse in his left leg, right hand, and both feet. He also reports migraines that occur separately, also happening 3-4 times a month depending on his activities, worse when hot. He describes pulsating/throbbing pain on the frontal region radiating to the left lasting 30 minutes to 3 hours. If the pain is moderate, he does not take medication. For severe pain he takes Excedrin migraine but it does not help. He takes it around once a month. He is sensitive to lights, no nausea/vomiting. His older sister has migraines. He lives alone. He states the vertigo does not occur when driving. He denies any olfactory/gustatory hallucinations, myoclonic jerks, staring/unresponsive episodes. He denies any gaps in time but has a short attention span, people have told him they have to repeat themselves, he says he cannot hear them. He reports hearing loss. He has been working as a Physiological scientist  for 19 years. He usually gets 6-7 hours of sleep. He drinks around 2 beers every 2 weeks.    PAST MEDICAL HISTORY: Past Medical History:  Diagnosis Date   Diabetes mellitus without complication ([place])    Rheumatic fever     MEDICATIONS: Current Outpatient Medications on File Prior to Visit  Medication Sig Dispense Refill   ibuprofen (ADVIL,MOTRIN) 800 MG tablet Take 1 tablet (800 mg  total) by mouth 3 (three) times daily. (Patient not taking: Reported on 01/01/2022) 21 tablet 0   ketoconazole (NIZORAL) 2 % cream Apply topically 2 (two) times daily as needed.     topiramate (TOPAMAX) 25 MG tablet Take 1 tablet every night for 1 week, then increase to 2 tablets every night 60 tablet 11   No current facility-administered medications on file prior to visit.    ALLERGIES: No Known Allergies  FAMILY HISTORY: History reviewed. No pertinent family history.  SOCIAL HISTORY: Social History   Socioeconomic History   Marital status: Single    Spouse name: Not on file   Number of children: 2   Years of education: Not on file   Highest education level: Not on file  Occupational History   Not on file  Tobacco Use   Smoking status: Former   Smokeless tobacco: Not on file  Vaping Use   Vaping Use: Never used  Substance and Sexual Activity   Alcohol use: Yes   Drug use: No   Sexual activity: Not on file  Other Topics Concern   Not on file  Social History Narrative   Right handed   No caffeine   One story home   Social Determinants of Health   Financial Resource Strain: Not on file  Food Insecurity: Not on file  Transportation Needs: Not on file  Physical Activity: Not on file  Stress: Not on file  Social Connections: Not on file  Intimate Partner Violence: Not on file     PHYSICAL EXAM: Vitals:   10/31/22 1003  BP: (!) 147/84  Pulse: 72  SpO2: 99%   General: No acute distress Head:  Normocephalic/atraumatic Skin/Extremities: No rash, no edema Neurological Exam: alert and awake. No aphasia or dysarthria. Fund of knowledge is appropriate. Attention and concentration are normal.   Cranial nerves: Pupils equal, round. Extraocular movements intact with no nystagmus. Visual fields full.  No facial asymmetry.  Motor: Bulk and tone normal, muscle strength 5/5 throughout with no pronator drift.   Finger to nose testing intact.  Gait slow and cautious with knees  bent reporting knee and back pain, no ataxia.   IMPRESSION: This is a 62 yo RH man with a history of diet-controlled diabetes, herniated disc, with vertigo and migraines occurring independently of each other. Proceed with brain MRI/MRA (he is reporting pulsatile tinnitus). We discussed treatment for vertigo would be vestibular therapy, he would like to hold off until the summer when he is less busy. He will call when he is ready for Vestibular therapy. He was given home vestibular exercises to try. He has prn sumatriptan for migraine rescue, he had side effects on daily preventative medication and agrees to hold off on starting a different medication for now. Follow-up in 6 months, call for any changes.    Thank you for allowing me to participate in his care.  Please do not hesitate to call for any questions or concerns.    Ellouise Newer, M.D.   CC: Dr. Moreen Fowler

## 2022-10-31 NOTE — Patient Instructions (Signed)
Good to see you.  Schedule MRI brain with and without contrast, MRA head without contrast  2. Please call our office when you are ready to have the Vestibular therapy ordered to help with the vertigo  3. Continue as needed sumatriptan  4. Follow-up in 6 months, call for any changes

## 2023-01-07 ENCOUNTER — Encounter: Payer: Self-pay | Admitting: Neurology

## 2023-01-16 ENCOUNTER — Ambulatory Visit
Admission: RE | Admit: 2023-01-16 | Discharge: 2023-01-16 | Disposition: A | Discharge status: Home / Self Care | Payer: 59 | Source: Ambulatory Visit | Attending: Neurology | Admitting: Neurology

## 2023-01-16 DIAGNOSIS — H9313 Tinnitus, bilateral: Secondary | ICD-10-CM

## 2023-01-16 DIAGNOSIS — R42 Dizziness and giddiness: Secondary | ICD-10-CM

## 2023-01-16 DIAGNOSIS — G43009 Migraine without aura, not intractable, without status migrainosus: Secondary | ICD-10-CM

## 2023-01-16 MED ORDER — GADOPICLENOL 0.5 MMOL/ML IV SOLN
8.0000 mL | Freq: Once | INTRAVENOUS | Status: AC | PRN
  Administered 2023-01-16: 8 mL via INTRAVENOUS

## 2023-05-27 ENCOUNTER — Ambulatory Visit: Payer: 59 | Admitting: Neurology

## 2023-12-08 ENCOUNTER — Ambulatory Visit: Payer: 59 | Admitting: Neurology

## 2024-08-31 ENCOUNTER — Encounter: Payer: Self-pay | Admitting: Neurology

## 2024-11-21 ENCOUNTER — Telehealth: Payer: 59 | Admitting: Neurology

## 2024-11-21 DIAGNOSIS — R42 Dizziness and giddiness: Secondary | ICD-10-CM

## 2024-11-21 DIAGNOSIS — G43009 Migraine without aura, not intractable, without status migrainosus: Secondary | ICD-10-CM

## 2024-11-21 NOTE — Progress Notes (Unsigned)
 "  Virtual Visit via Video Note  This visit type was conducted with patient consent. This format is felt to be most appropriate for this patient at this time. Physical exam was limited by quality of the video and audio technology used for the visit.    Consent was obtained for video visit:  Yes.   Answered questions that patient had about telehealth interaction:  Yes.   Patient is aware of the limitations, risks, security and privacy concerns of performing an evaluation and management service by telemedicine. The patient expressed understanding and agreed to proceed.  Pt location: Home Physician Location: office Name of referring provider:  Seabron Lenis, MD I connected with Ozell Faes at patients initiation/request on 11/21/2024 at  1:00 PM EST by video enabled telemedicine application and verified that I am speaking with the correct person using two identifiers. Pt MRN:  [id number] Pt DOB:  1961-04-27 Video Participants:  Ozell Faes  Discussed the use of AI scribe software for clinical note transcription with the patient, who gave verbal consent to proceed.  History of Present Illness The patient had a virtual video visit on 11/21/2024. He was last seen in the neurology clinic in January 2024 for vertigo and migraines. On his last visit, he reported daily vertigo, migraine 3-4 times a month, and bilateral pulsatile tinnitus. I personally reviewed MRI brain with and without contrast and MRA head without contrast done 12/2022 which did not show any acute changes, no intracranial or vessel abnormalities noted. There was mild mucosal thickening of the paranasal sinuses.   Since his last visit, he reports vertigo has improved since initiating meclizine 25 mg as needed in October 2025. He uses meclizine approximately once weekly or less, at the onset of symptoms. He reports that when he started taking meclizine, he was no longer dizzy when standing up, though he continues to experience  occasional veering to the left, which is now less frequent and less severe. He denies recent falls, numbness, tingling, or weakness in the extremities.  Migraine headaches are less frequent during colder months. He uses sumatriptan as needed, typically two to three times per week, most often triggered by hot showers. He has not modified shower temperature and consistently uses hot water.   Memory impairment has become more pronounced, with increased forgetfulness necessitating written reminders for tasks. He has not undergone recent laboratory evaluation for reversible causes and [facility]. Sleep duration is suboptimal, averaging four to six hours nightly.  He continues to experience persistent left hand pain following a finger injury in 2025. Heat/wax therapy and arthritis gloves provide symptomatic relief, while topical arthritis medication was previously ineffective. 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 He retired in 2024 due to this condition but continues to work part-time as tolerated. A back brace provides support and aids in pain management.  Current medications include meclizine 25 mg as needed for vertigo, sumatriptan as needed for migraines, rosuvastatin 5 mg three times weekly, metformin, and pumpkin seed supplements. He discontinued tamsulosin due to dizziness.  History on Initial Assessment 01/01/2022: This is a 64 year old right-handed man with a history of diet-controlled diabetes, herniated disc, migraines, presenting for evaluation of vertigo. He reports that after he was treated for a herniated disc 6 years ago, he started having vertigo and slight migraines. He recalls with the first episode he fell forward and passed out for a couple of minutes. He would have it when bending down with  the weed eater, he would bend over, stand then fall over. They were initially occurring every couple of months, but recently have  increased to 3-4 times a month. They usually last 15-25 seconds. He is usually aware of his surroundings, no confusion. He has to stop talking because he is scared, but he can talk/comprehend. He has had it while sitting on the toilet, or while standing and talking, the room would start spinning and he staggers, feeling like he will fall down and have to catch himself. If he was sitting on the toilet, he may fall to the right or forward, sometimes he staggers to the left side. He denies any vertigo when supine. There is no nausea/vomiting. He reports pulsatile tinnitus, he can hear his heart beating with swooshing and popping in both ears, worse on the left. No vision changes. Sometimes his right hand and feet would tingle when supine. He has a charley horse in his left leg, right hand, and both feet. He also reports migraines that occur separately, also happening 3-4 times a month depending on his activities, worse when hot. He describes pulsating/throbbing pain on the frontal region radiating to the left lasting 30 minutes to 3 hours. If the pain is moderate, he does not take medication. For severe pain he takes Excedrin migraine but it does not help. He takes it around once a month. He is sensitive to lights, no nausea/vomiting. His older sister has migraines. He lives alone. He states the vertigo does not occur when driving. He denies any olfactory/gustatory hallucinations, myoclonic jerks, staring/unresponsive episodes. He denies any gaps in time but has a short attention span, people have told him they have to repeat themselves, he says he cannot hear them. He reports hearing loss. He has been working as a designer, industrial/product for 19 years. He usually gets 6-7 hours of sleep. He drinks around 2 beers every 2 weeks.    Medications Ordered Prior to Encounter[1]   Observations/Objective:   GEN:  The patient appears stated age and is in NAD. Neurological examination: Patient is awake, alert. No aphasia or  dysarthria. Intact fluency and comprehension. Cranial nerves: Extraocular movements intact. No facial asymmetry. Motor: moves all extremities symmetrically, at least anti-gravity x 4.    Assessment and Plan:   This is a 64 yo RH man with a history of diet-controlled diabetes, herniated disc, with vertigo and migraines occurring independently of each other. MRI brain/MRA head normal. He reports improvement of vertigo with prn meclizine. Migraines also overall stable with prn sumatriptan. We discussed that if vertigo worsens, would proceed with vestibular therapy. He would like to wait and assess how symptoms are with warmer months as heat is a trigger for his symptoms. Follow-up in 6 months, call for any changes.    Follow Up Instructions:    -I discussed the assessment and treatment plan with the patient. The patient was provided an opportunity to ask questions and all were answered. The patient agreed with the plan and demonstrated an understanding of the instructions.   The patient was advised to call back or seek an in-person evaluation if the symptoms worsen or if the condition fails to improve as anticipated.     Darice CHRISTELLA Shivers, MD     [1]  Current Outpatient Medications on File Prior to Visit  Medication Sig Dispense Refill   ketoconazole (NIZORAL) 2 % cream Apply topically 2 (two) times daily as needed.     meclizine (ANTIVERT) 25 MG tablet Take 25 mg  by mouth 2 (two) times daily as needed for dizziness.     metFORMIN (GLUCOPHAGE-XR) 500 MG 24 hr tablet Take 1,000 mg by mouth daily with breakfast.     rosuvastatin (CRESTOR) 5 MG tablet Take 5 mg by mouth daily. Takes 3 days a week     SUMAtriptan (IMITREX) 100 MG tablet Take 100 mg by mouth every 2 (two) hours as needed.     ibuprofen  (ADVIL ,MOTRIN ) 800 MG tablet Take 1 tablet (800 mg total) by mouth 3 (three) times daily. 21 tablet 0   tamsulosin (FLOMAX) 0.4 MG CAPS capsule Take 0.4 mg by mouth daily.     No current  facility-administered medications on file prior to visit.   "

## 2024-11-21 NOTE — Patient Instructions (Addendum)
 Good to see you!  Continue as needed meclizine and sumatriptan for your symptoms  2. See how the next few months go, we can order vestibular therapy if dizziness worsens once warmer months come  3. Follow-up in 6 months, call for any changes

## 2024-11-23 ENCOUNTER — Encounter: Payer: Self-pay | Admitting: Neurology

## 2025-05-23 ENCOUNTER — Ambulatory Visit: Payer: Self-pay | Admitting: Neurology
